# Patient Record
Sex: Female | Born: 1965
Health system: Southern US, Community
[De-identification: ages and names within clinical notes are randomized; demographics above are authoritative.]

## PROBLEM LIST (undated history)

## (undated) DIAGNOSIS — I8393 Asymptomatic varicose veins of bilateral lower extremities: Secondary | ICD-10-CM

## (undated) DIAGNOSIS — G5139 Clonic hemifacial spasm, unspecified: Principal | ICD-10-CM

## (undated) HISTORY — DX: Clonic hemifacial spasm, unspecified: G51.39

## (undated) HISTORY — DX: Asymptomatic varicose veins of bilateral lower extremities: I83.93

---

## 2000-08-09 ENCOUNTER — Other Ambulatory Visit: Admission: RE | Admit: 2000-08-09 | Discharge: 2000-08-09 | Payer: Self-pay | Admitting: *Deleted

## 2000-10-21 ENCOUNTER — Ambulatory Visit (HOSPITAL_COMMUNITY): Admission: AD | Admit: 2000-10-21 | Discharge: 2000-10-21 | Payer: Self-pay | Admitting: *Deleted

## 2000-11-01 ENCOUNTER — Inpatient Hospital Stay (HOSPITAL_COMMUNITY): Admission: RE | Admit: 2000-11-01 | Discharge: 2000-11-03 | Payer: Self-pay | Admitting: *Deleted

## 2017-08-23 DIAGNOSIS — R0789 Other chest pain: Secondary | ICD-10-CM | POA: Diagnosis not present

## 2017-08-24 DIAGNOSIS — G5131 Clonic hemifacial spasm, right: Secondary | ICD-10-CM | POA: Diagnosis not present

## 2017-11-16 ENCOUNTER — Ambulatory Visit (HOSPITAL_COMMUNITY)
Admission: RE | Admit: 2017-11-16 | Discharge: 2017-11-16 | Disposition: A | Discharge status: Home / Self Care | Payer: BLUE CROSS/BLUE SHIELD | Source: Ambulatory Visit | Attending: Physician Assistant | Admitting: Physician Assistant

## 2017-11-16 ENCOUNTER — Other Ambulatory Visit (HOSPITAL_COMMUNITY): Payer: Self-pay | Admitting: Physician Assistant

## 2017-11-16 DIAGNOSIS — H02843 Edema of right eye, unspecified eyelid: Secondary | ICD-10-CM | POA: Diagnosis not present

## 2017-11-16 DIAGNOSIS — R600 Localized edema: Secondary | ICD-10-CM | POA: Insufficient documentation

## 2017-11-16 DIAGNOSIS — M79661 Pain in right lower leg: Secondary | ICD-10-CM | POA: Diagnosis not present

## 2017-11-16 DIAGNOSIS — I83813 Varicose veins of bilateral lower extremities with pain: Secondary | ICD-10-CM | POA: Diagnosis not present

## 2017-11-20 ENCOUNTER — Other Ambulatory Visit: Payer: Self-pay

## 2017-11-20 DIAGNOSIS — I83893 Varicose veins of bilateral lower extremities with other complications: Secondary | ICD-10-CM

## 2017-11-21 DIAGNOSIS — G5131 Clonic hemifacial spasm, right: Secondary | ICD-10-CM | POA: Diagnosis not present

## 2017-11-22 ENCOUNTER — Other Ambulatory Visit: Payer: Self-pay | Admitting: Oculoplastics Ophthalmology

## 2017-11-22 DIAGNOSIS — G5131 Clonic hemifacial spasm, right: Secondary | ICD-10-CM

## 2017-11-30 ENCOUNTER — Ambulatory Visit (HOSPITAL_COMMUNITY): Payer: Self-pay

## 2017-11-30 ENCOUNTER — Ambulatory Visit (HOSPITAL_COMMUNITY)
Admission: RE | Admit: 2017-11-30 | Discharge: 2017-11-30 | Disposition: A | Discharge status: Home / Self Care | Payer: BLUE CROSS/BLUE SHIELD | Source: Ambulatory Visit | Attending: Oculoplastics Ophthalmology | Admitting: Oculoplastics Ophthalmology

## 2017-11-30 DIAGNOSIS — G245 Blepharospasm: Secondary | ICD-10-CM | POA: Diagnosis not present

## 2017-11-30 DIAGNOSIS — G5131 Clonic hemifacial spasm, right: Secondary | ICD-10-CM | POA: Diagnosis not present

## 2017-11-30 MED ORDER — GADOBENATE DIMEGLUMINE 529 MG/ML IV SOLN
19.0000 mL | Freq: Once | INTRAVENOUS | Status: AC | PRN
  Administered 2017-11-30: 19 mL via INTRAVENOUS

## 2017-12-08 ENCOUNTER — Ambulatory Visit: Payer: BLUE CROSS/BLUE SHIELD | Admitting: Neurology

## 2017-12-22 DIAGNOSIS — Z0000 Encounter for general adult medical examination without abnormal findings: Secondary | ICD-10-CM | POA: Diagnosis not present

## 2017-12-29 DIAGNOSIS — Z01419 Encounter for gynecological examination (general) (routine) without abnormal findings: Secondary | ICD-10-CM | POA: Diagnosis not present

## 2017-12-29 DIAGNOSIS — Z6833 Body mass index (BMI) 33.0-33.9, adult: Secondary | ICD-10-CM | POA: Diagnosis not present

## 2017-12-29 DIAGNOSIS — R300 Dysuria: Secondary | ICD-10-CM | POA: Diagnosis not present

## 2018-01-04 ENCOUNTER — Encounter: Payer: Self-pay | Admitting: Surgery

## 2018-02-08 ENCOUNTER — Encounter: Payer: Self-pay | Admitting: Neurology

## 2018-02-08 ENCOUNTER — Ambulatory Visit: Payer: BLUE CROSS/BLUE SHIELD | Admitting: Neurology

## 2018-02-08 DIAGNOSIS — G5139 Clonic hemifacial spasm, unspecified: Secondary | ICD-10-CM

## 2018-02-08 HISTORY — DX: Clonic hemifacial spasm, unspecified: G51.39

## 2018-02-08 MED ORDER — BACLOFEN 10 MG PO TABS: 5.0000 mg | ORAL_TABLET | Freq: Two times a day (BID) | ORAL | 2 refills | Status: DC

## 2018-02-08 NOTE — Progress Notes (Signed)
Reason for visit: Right hemifacial spasm  Referring physician: Dr. Manus Rudd is a 52 y.o. female  History of present illness:  Ms. Lapaglia is a 52 year old right-handed white female with a history of onset of right hemifacial spasm that began about 2 years ago.  The patient has had gradual worsening of her symptoms, she does report some occasional mild discomfort over the right frontotemporal area.  The twitches mainly involve the muscles around the right eye and around the mouth on the right.  The patient denies any involvement of the pharyngeal muscles or tongue.  She has never had Bell's palsy previously.  The patient has not had any weakness or numbness of the arms or legs, she denies any balance changes or difficulty controlling the bowels or the bladder.  The patient denies any double vision or loss of vision.  She does report some photophobia at times and she has to wear dark glasses a lot.  The patient has undergone MRI evaluation that included the brainstem area that did not show evidence of compression of the facial nerve.  The patient is sent to this office for further evaluation and management of her symptoms.  Past Medical History:  Diagnosis Date  . Hemifacial spasm 02/08/2018   right  . Varicose veins of both lower extremities     History reviewed. No pertinent surgical history.  Family History  Problem Relation Age of Onset  . Cirrhosis Mother   . Hypertension Father   . COPD Brother     Social history:  reports that she has never smoked. She has never used smokeless tobacco. She reports that she does not drink alcohol or use drugs.  Medications:  Prior to Admission medications   Medication Sig Start Date End Date Taking? Authorizing Provider  triamcinolone lotion (KENALOG) 0.1 % Apply 1 application topically 2 (two) times daily.   Yes [provider]  baclofen (LIORESAL) 10 MG tablet Take 0.5 tablets (5 mg total) by mouth 2 (two) times daily.  02/08/18   York Spaniel, MD     No Known Allergies  ROS:  Out of a complete 14 system review of symptoms, the patient complains only of the following symptoms, and all other reviewed systems are negative.  Swelling in the legs Headache  Blood pressure (!) 141/93, pulse 64, height 5\' 7"  (1.702 m), weight 208 lb (94.3 kg).  Physical Exam  General: The patient is alert and cooperative at the time of the examination.  Patient is moderately obese.  Eyes: Pupils are equal, round, and reactive to light. Discs are flat bilaterally.  Neck: The neck is supple, no carotid bruits are noted.  Respiratory: The respiratory examination is clear.  Cardiovascular: The cardiovascular examination reveals a regular rate and rhythm, no obvious murmurs or rubs are noted.  Skin: Extremities are with 2+ edema below the knees bilaterally.  Neurologic Exam  Mental status: The patient is alert and oriented x 3 at the time of the examination. The patient has apparent normal recent and remote memory, with an apparently normal attention span and concentration ability.  Cranial nerves: Facial symmetry is present. There is good sensation of the face to pinprick and soft touch bilaterally. The strength of the facial muscles and the muscles to head turning and shoulder shrug are normal bilaterally. Speech is well enunciated, no aphasia or dysarthria is noted. Extraocular movements are full. Visual fields are full. The tongue is midline, and the patient has symmetric elevation  of the soft palate. No obvious hearing deficits are noted.  Upon observation of the face, there are continuous twitches involving the facial muscles from the eye level down to include the platysma on the right.  There is partial closure of the right eye and some pulling at the corner of the mouth on the right.  Motor: The motor testing reveals 5 over 5 strength of all 4 extremities. Good symmetric motor tone is noted throughout.  Sensory:  Sensory testing is intact to pinprick, soft touch, vibration sensation, and position sense on all 4 extremities. No evidence of extinction is noted.  Coordination: Cerebellar testing reveals good finger-nose-finger and heel-to-shin bilaterally.  Gait and station: Gait is normal. Tandem gait is normal. Romberg is negative. No drift is seen.  Reflexes: Deep tendon reflexes are symmetric and normal bilaterally. Toes are downgoing bilaterally.   MRI 11/30/17:  IMPRESSION: No mass, abnormal enhancement, or significant neurovascular compression of the trigeminal nerve complexes.  * MRI scan images were reviewed online. I agree with the written report.    Assessment/Plan:  1.  Right hemifacial spasm  The patient indicates that she is not sure she wants to have Botox therapy which represents the most effective treatment for hemifacial spasm.  Medications can be used to reduce symptoms but are usually less effective than Botox.  Gabapentin, carbamazepine, clonazepam, and baclofen can be used as therapeutic agents.  We will start with low-dose baclofen taking 5 mg twice daily, she will call for any dose adjustments.  She will follow-up in 3 months.  Marlan Palau MD 02/08/2018 3:35 PM  [facility] [address] [place], [place] [address]  Phone [phone] Fax [phone]

## 2018-02-08 NOTE — Patient Instructions (Signed)
We will start baclofen 10 mg taking one half tablet twice a day, call for any dose adjustments.

## 2018-03-05 ENCOUNTER — Encounter: Payer: BLUE CROSS/BLUE SHIELD | Admitting: Surgery

## 2018-03-05 ENCOUNTER — Ambulatory Visit (HOSPITAL_COMMUNITY): Payer: BLUE CROSS/BLUE SHIELD | Attending: Surgery

## 2018-03-06 ENCOUNTER — Encounter: Payer: Self-pay | Admitting: Surgery

## 2018-03-13 ENCOUNTER — Telehealth: Payer: Self-pay | Admitting: Neurology

## 2018-03-13 MED ORDER — BACLOFEN 10 MG PO TABS
10.0000 mg | ORAL_TABLET | Freq: Two times a day (BID) | ORAL | 2 refills | Status: DC
Start: 2018-03-13 — End: 2018-05-30

## 2018-03-13 NOTE — Telephone Encounter (Signed)
I called the patient.  The patient is tolerating the low-dose baclofen but it is not helping her hemifacial spasm.  We will increase the dose to 10 mg twice daily.

## 2018-03-13 NOTE — Telephone Encounter (Signed)
Pt requesting a call stating she would like to discuss rasing her dosing to 1 tablet in the morning and 1 tablet at night for medication baclofen (LIORESAL) 10 MG tablet, please call to advise

## 2018-03-13 NOTE — Telephone Encounter (Signed)
Spoke with pt. She sts. she is tolerating Baclofen 5mg  bid well with no drowsiness, gait disturbance, but does not feel it is helping hemifacial spasms. She would like to increase to 10mg  bid. Will check with Dr. Ignacia Bayley

## 2018-03-16 DIAGNOSIS — Z6834 Body mass index (BMI) 34.0-34.9, adult: Secondary | ICD-10-CM | POA: Diagnosis not present

## 2018-03-16 DIAGNOSIS — R21 Rash and other nonspecific skin eruption: Secondary | ICD-10-CM | POA: Diagnosis not present

## 2018-04-16 DIAGNOSIS — Z0289 Encounter for other administrative examinations: Secondary | ICD-10-CM

## 2018-05-30 ENCOUNTER — Telehealth: Payer: Self-pay | Admitting: Neurology

## 2018-05-30 MED ORDER — BACLOFEN 10 MG PO TABS: 10.0000 mg | ORAL_TABLET | Freq: Three times a day (TID) | ORAL | 2 refills | Status: DC

## 2018-05-30 NOTE — Addendum Note (Signed)
Addended by: York Spaniel on: 05/30/2018 04:30 PM   Modules accepted: Orders

## 2018-05-30 NOTE — Telephone Encounter (Signed)
Pt has called to see if Dr Anne Hahn will increase her take of the baclofen (LIORESAL) 10 MG tablet to 1 extra pill for full effect.  Please call,  [id number] - [place], [place] *no changes to insurance*

## 2018-05-30 NOTE — Telephone Encounter (Signed)
I called the patient.  The patient is on baclofen 10 mg twice daily, she is not getting much benefit from this, we will go up to 10 mg 3 times daily.  The treatment of choice once again for the hemifacial spasm is Botox injections.

## 2018-06-06 ENCOUNTER — Ambulatory Visit: Payer: BLUE CROSS/BLUE SHIELD | Admitting: Nurse Practitioner

## 2018-07-04 DIAGNOSIS — Z6835 Body mass index (BMI) 35.0-35.9, adult: Secondary | ICD-10-CM | POA: Diagnosis not present

## 2018-07-04 DIAGNOSIS — M546 Pain in thoracic spine: Secondary | ICD-10-CM | POA: Diagnosis not present

## 2018-07-05 ENCOUNTER — Telehealth: Payer: Self-pay | Admitting: Neurology

## 2018-07-05 MED ORDER — BACLOFEN 10 MG PO TABS: 10.0000 mg | ORAL_TABLET | Freq: Four times a day (QID) | ORAL | 2 refills | Status: DC

## 2018-07-05 NOTE — Telephone Encounter (Signed)
Pt is calling to see if she is able to take her baclofen (LIORESAL) 10 MG tablet 4 times a day instead of the 3 since 3 times is not helping. Please advise.

## 2018-07-05 NOTE — Telephone Encounter (Signed)
I called the patient.  The baclofen dose can be increased, we will go to 10 mg 4 times daily.  The maximum dose is 80 mg daily.

## 2018-10-31 DIAGNOSIS — R21 Rash and other nonspecific skin eruption: Secondary | ICD-10-CM | POA: Diagnosis not present

## 2018-10-31 DIAGNOSIS — Z6836 Body mass index (BMI) 36.0-36.9, adult: Secondary | ICD-10-CM | POA: Diagnosis not present

## 2019-05-18 DIAGNOSIS — I83892 Varicose veins of left lower extremities with other complications: Secondary | ICD-10-CM | POA: Diagnosis not present

## 2019-07-09 DIAGNOSIS — L304 Erythema intertrigo: Secondary | ICD-10-CM | POA: Diagnosis not present

## 2019-07-09 DIAGNOSIS — M25569 Pain in unspecified knee: Secondary | ICD-10-CM | POA: Diagnosis not present

## 2019-07-09 DIAGNOSIS — R609 Edema, unspecified: Secondary | ICD-10-CM | POA: Diagnosis not present

## 2019-07-09 DIAGNOSIS — I8390 Asymptomatic varicose veins of unspecified lower extremity: Secondary | ICD-10-CM | POA: Diagnosis not present

## 2019-07-09 DIAGNOSIS — Z6837 Body mass index (BMI) 37.0-37.9, adult: Secondary | ICD-10-CM | POA: Diagnosis not present

## 2019-07-23 DIAGNOSIS — Z6835 Body mass index (BMI) 35.0-35.9, adult: Secondary | ICD-10-CM | POA: Diagnosis not present

## 2019-07-23 DIAGNOSIS — M25562 Pain in left knee: Secondary | ICD-10-CM | POA: Diagnosis not present

## 2019-08-09 DIAGNOSIS — M25562 Pain in left knee: Secondary | ICD-10-CM | POA: Diagnosis not present

## 2019-08-09 DIAGNOSIS — I8390 Asymptomatic varicose veins of unspecified lower extremity: Secondary | ICD-10-CM | POA: Diagnosis not present

## 2019-08-09 DIAGNOSIS — Z6835 Body mass index (BMI) 35.0-35.9, adult: Secondary | ICD-10-CM | POA: Diagnosis not present

## 2019-08-09 DIAGNOSIS — R609 Edema, unspecified: Secondary | ICD-10-CM | POA: Diagnosis not present

## 2019-08-19 DIAGNOSIS — M25462 Effusion, left knee: Secondary | ICD-10-CM | POA: Diagnosis not present

## 2019-08-19 DIAGNOSIS — M25562 Pain in left knee: Secondary | ICD-10-CM | POA: Diagnosis not present

## 2019-09-09 DIAGNOSIS — M1712 Unilateral primary osteoarthritis, left knee: Secondary | ICD-10-CM | POA: Diagnosis not present

## 2019-09-09 DIAGNOSIS — M25562 Pain in left knee: Secondary | ICD-10-CM | POA: Diagnosis not present

## 2019-10-15 DIAGNOSIS — E039 Hypothyroidism, unspecified: Secondary | ICD-10-CM | POA: Diagnosis not present

## 2019-10-15 DIAGNOSIS — R739 Hyperglycemia, unspecified: Secondary | ICD-10-CM | POA: Diagnosis not present

## 2019-10-15 DIAGNOSIS — E785 Hyperlipidemia, unspecified: Secondary | ICD-10-CM | POA: Diagnosis not present

## 2019-10-15 DIAGNOSIS — R7303 Prediabetes: Secondary | ICD-10-CM | POA: Diagnosis not present

## 2019-10-15 DIAGNOSIS — Z0000 Encounter for general adult medical examination without abnormal findings: Secondary | ICD-10-CM | POA: Diagnosis not present

## 2019-10-17 DIAGNOSIS — Z0000 Encounter for general adult medical examination without abnormal findings: Secondary | ICD-10-CM | POA: Diagnosis not present

## 2019-10-21 ENCOUNTER — Other Ambulatory Visit (HOSPITAL_COMMUNITY): Payer: Self-pay | Admitting: Sports Medicine

## 2019-10-21 ENCOUNTER — Other Ambulatory Visit: Payer: Self-pay | Admitting: Sports Medicine

## 2019-10-21 DIAGNOSIS — M25562 Pain in left knee: Secondary | ICD-10-CM | POA: Diagnosis not present

## 2019-10-21 DIAGNOSIS — M1712 Unilateral primary osteoarthritis, left knee: Secondary | ICD-10-CM | POA: Diagnosis not present

## 2019-10-21 DIAGNOSIS — S83242A Other tear of medial meniscus, current injury, left knee, initial encounter: Secondary | ICD-10-CM

## 2019-11-26 ENCOUNTER — Ambulatory Visit (HOSPITAL_COMMUNITY)
Admission: RE | Admit: 2019-11-26 | Discharge: 2019-11-26 | Disposition: A | Discharge status: Home / Self Care | Payer: BC Managed Care – PPO | Source: Ambulatory Visit | Attending: Sports Medicine | Admitting: Sports Medicine

## 2019-11-26 ENCOUNTER — Other Ambulatory Visit: Payer: Self-pay

## 2019-11-26 DIAGNOSIS — M25562 Pain in left knee: Secondary | ICD-10-CM | POA: Diagnosis not present

## 2019-11-26 DIAGNOSIS — S83242A Other tear of medial meniscus, current injury, left knee, initial encounter: Secondary | ICD-10-CM | POA: Diagnosis not present

## 2019-12-03 DIAGNOSIS — S83242D Other tear of medial meniscus, current injury, left knee, subsequent encounter: Secondary | ICD-10-CM | POA: Diagnosis not present

## 2020-02-09 IMAGING — US US EXTREM LOW VENOUS*R*
1 series · 13 of 24 positions shown · non-contrast
Comparison: None.

CLINICAL DATA: Chronic right lower extremity pain and edema.
Evaluate for DVT.



[Series 1: us extrem low venous*right* · 0.09mm/px · 13 of 36 slices shown]
[im 1/36]
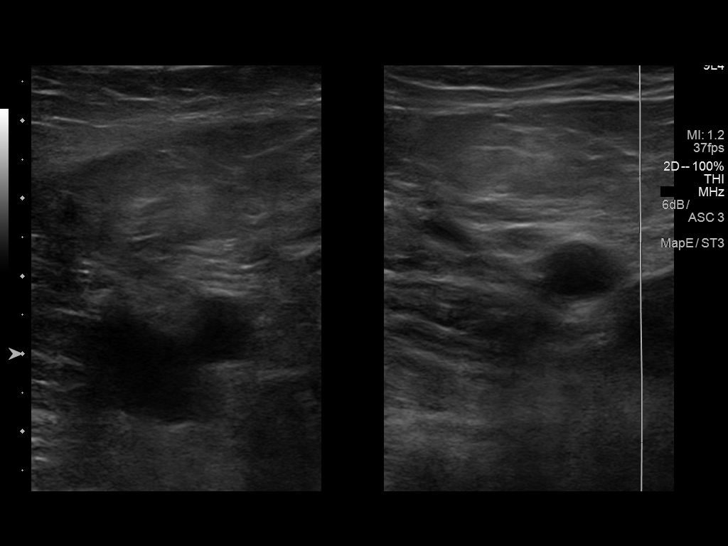
[im 4/36]
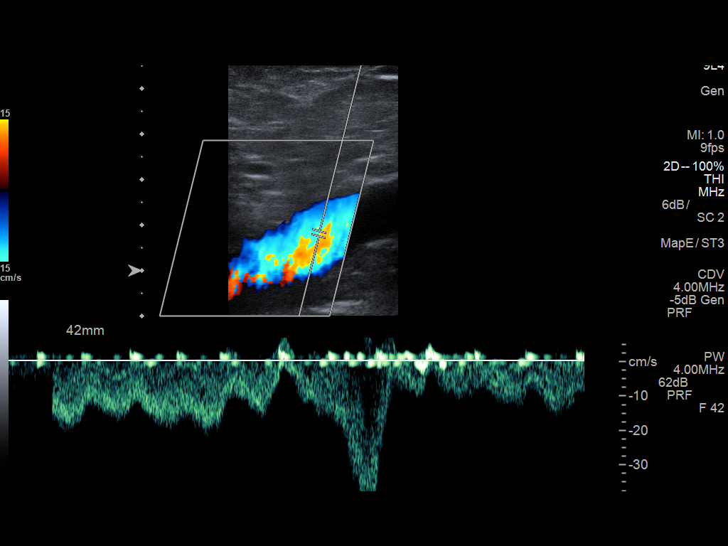
[im 7/36]
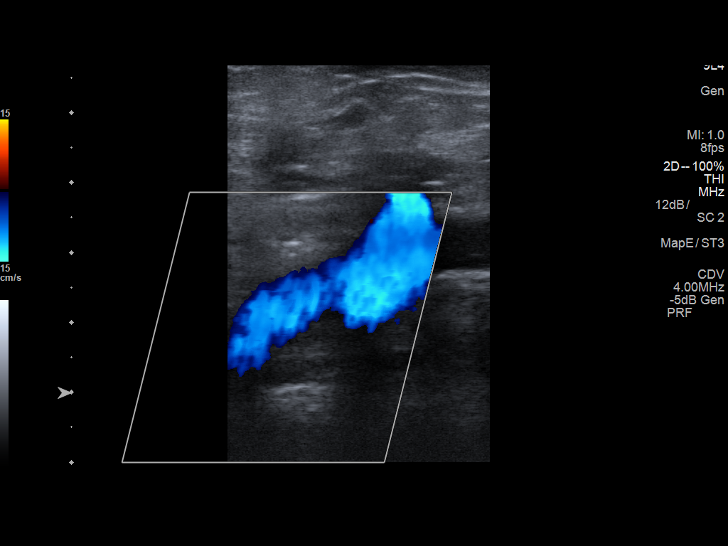
[im 10/36]
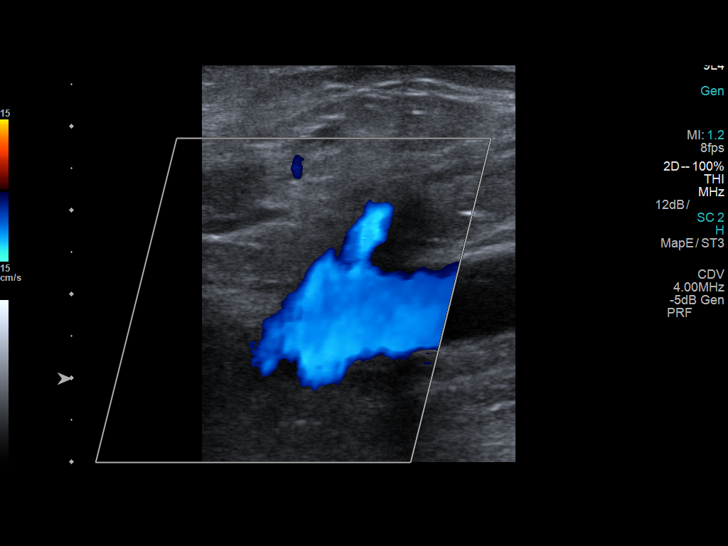
[im 13/36]
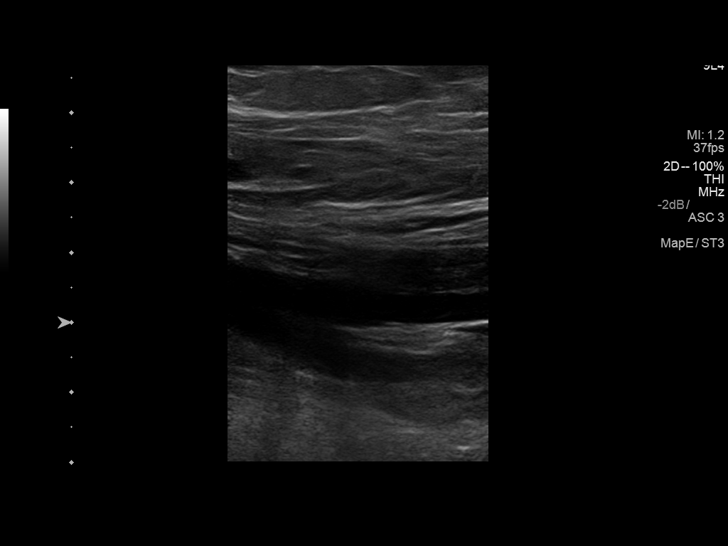
[im 16/36]
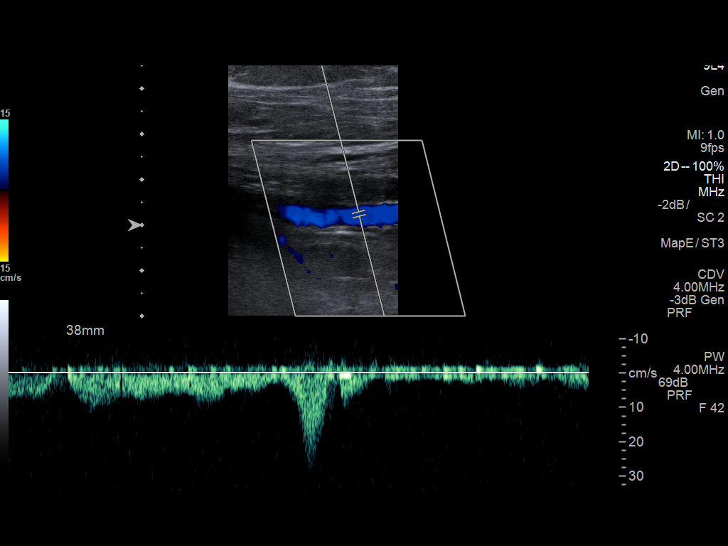
[im 19/36]
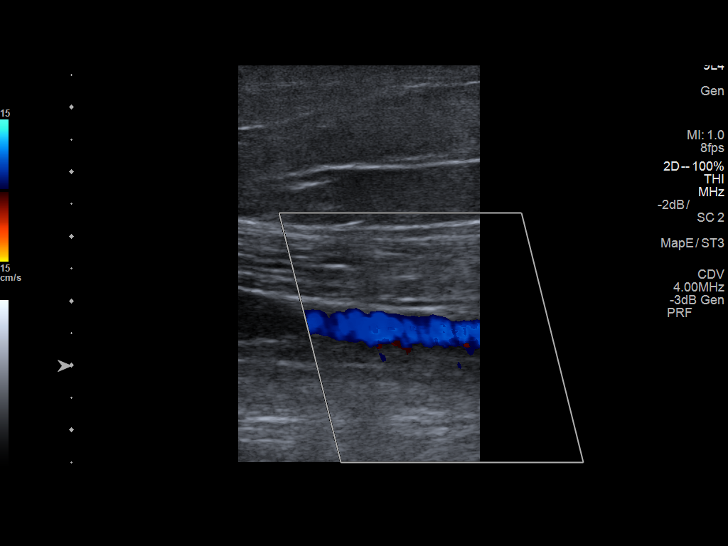
[im 20/36]
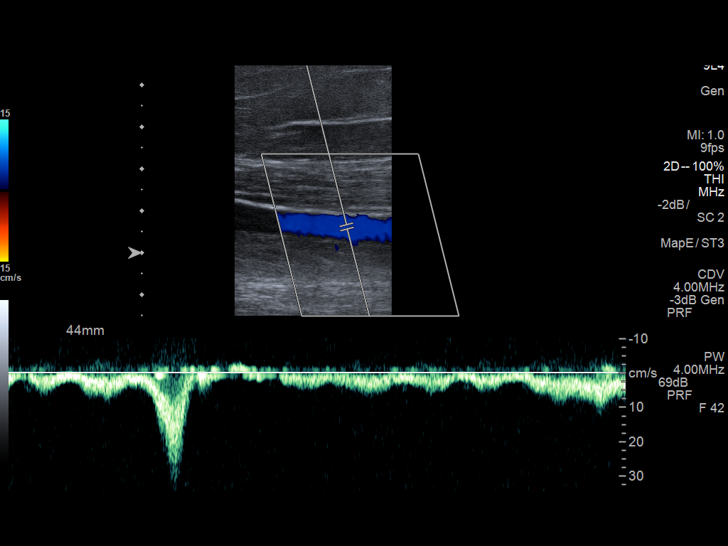
[im 23/36]
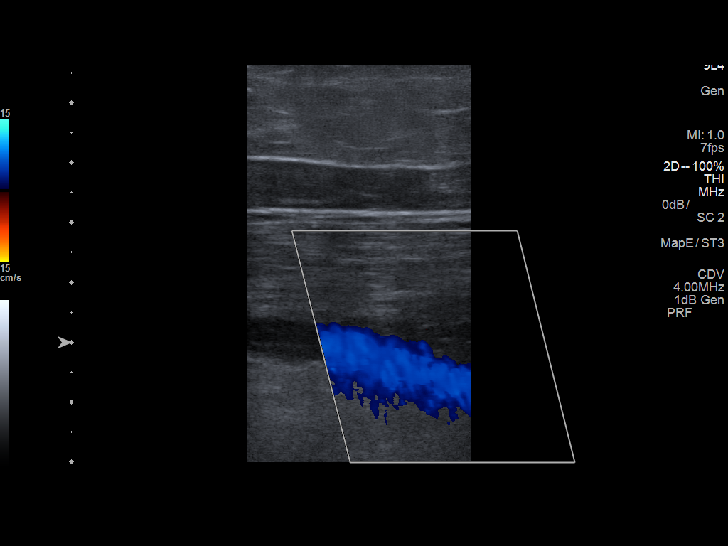
[im 26/36]
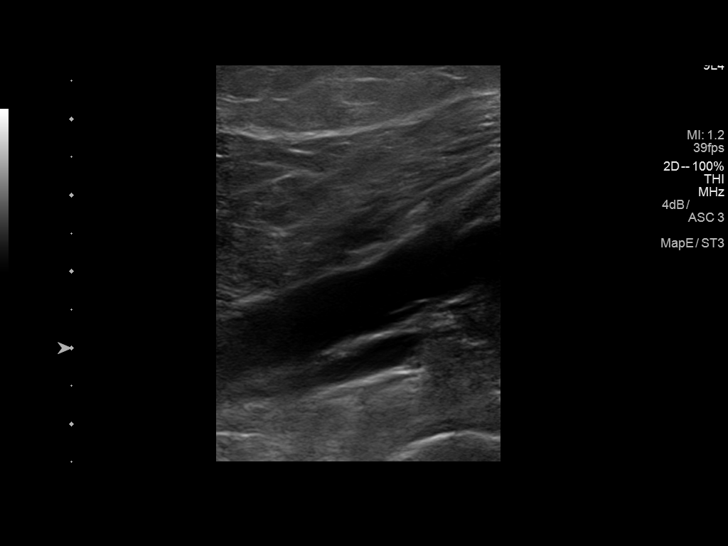
[im 29/36]
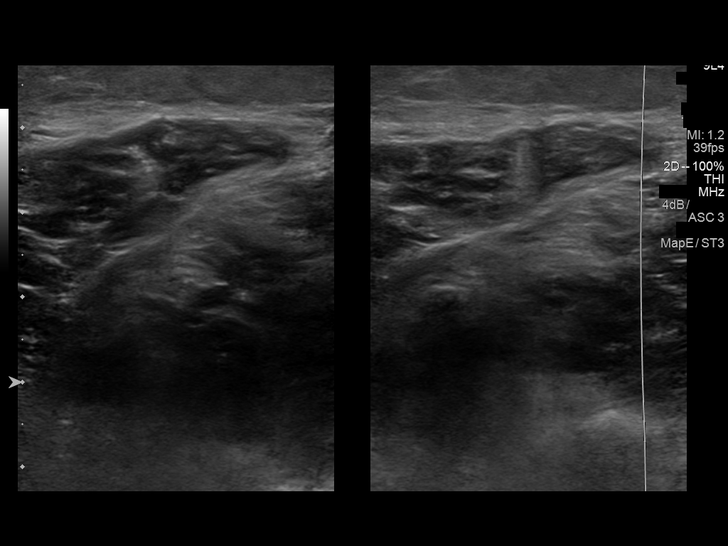
[im 32/36]
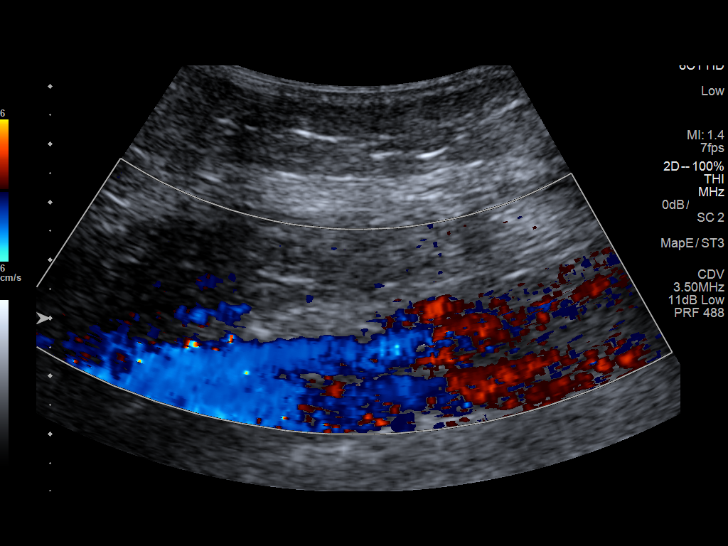
[im 36/36]
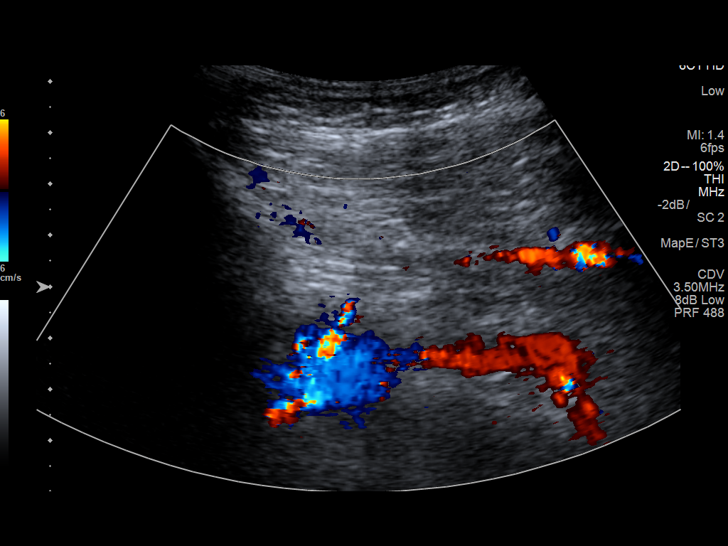

[13 of 24 positions shown; findings below may reference images not displayed]

FINDINGS: Contralateral Common Femoral Vein: Respiratory phasicity is normal
and symmetric with the symptomatic side. No evidence of thrombus.
Normal compressibility.

Common Femoral Vein: No evidence of thrombus. Normal
compressibility, respiratory phasicity and response to augmentation.

Saphenofemoral Junction: No evidence of thrombus. Normal
compressibility and flow on color Doppler imaging.

Profunda Femoral Vein: No evidence of thrombus. Normal
compressibility and flow on color Doppler imaging.

Femoral Vein: No evidence of thrombus. Normal compressibility,
respiratory phasicity and response to augmentation.

Popliteal Vein: No evidence of thrombus. Normal compressibility,
respiratory phasicity and response to augmentation.

Calf Veins: No evidence of thrombus. Normal compressibility and flow
on color Doppler imaging.

Superficial Great Saphenous Vein: No evidence of thrombus. Normal
compressibility.

Venous Reflux:  None.

Other Findings:  None.
IMPRESSION: No evidence of DVT within the right lower extremity.

## 2021-07-12 IMAGING — MR MR KNEE*L* W/O CM
7 series · 40 of 40 positions shown · non-contrast
Comparison: None.

CLINICAL DATA: Left knee pain and swelling for 4 months. No known
injury.

EXAM:
MRI OF THE LEFT KNEE WITHOUT CONTRAST
TECHNIQUE: Multiplanar, multisequence MR imaging of the knee was performed. No
intravenous contrast was administered.

[Series 8: T2 fat-sat · axial · left · 4.0mm · 0.39mm/px · z∈[-92,+33]mm · 7 of 26 slices shown (1 of 3)]
[im 1/26]
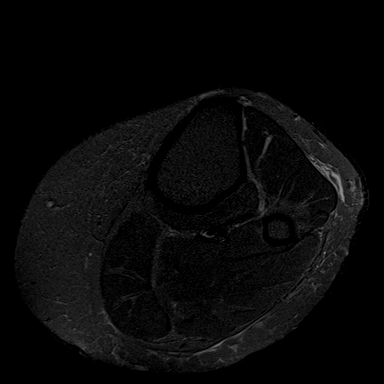
[im 5/26]
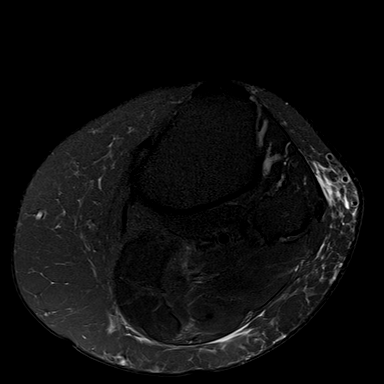
[im 9/26]
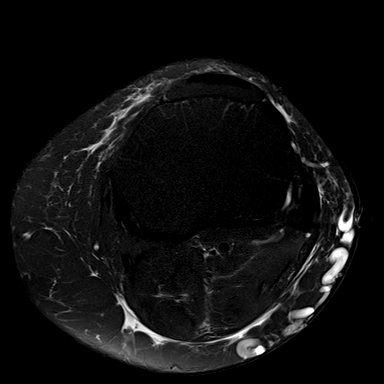
[im 13/26]
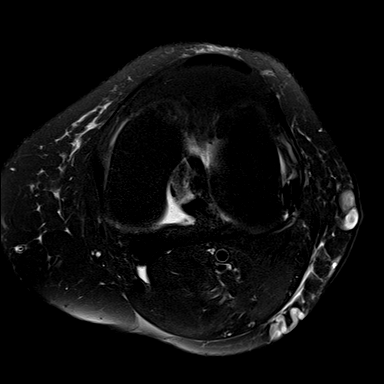
[im 17/26]
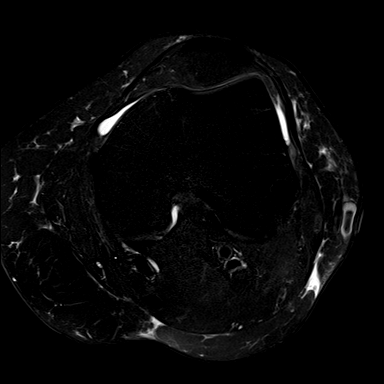
[im 21/26]
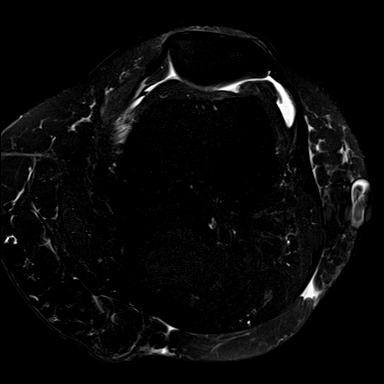
[im 26/26]
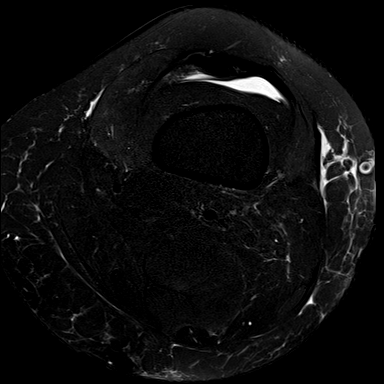

[Series 9: T1 · coronal · left · 4.0mm · 0.47mm/px · 6 of 22 slices shown]
[im 1/22]
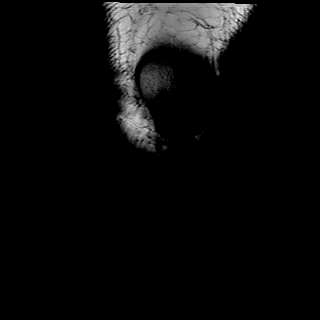
[im 5/22]
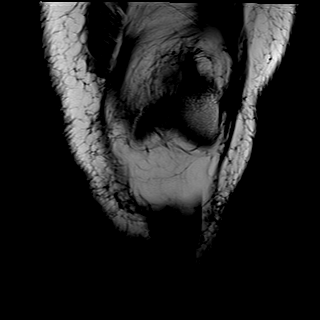
[im 9/22]
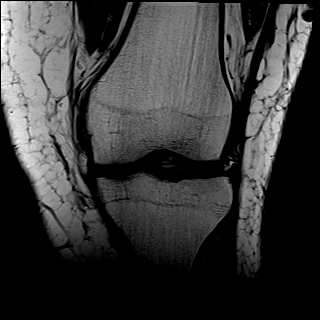
[im 13/22]
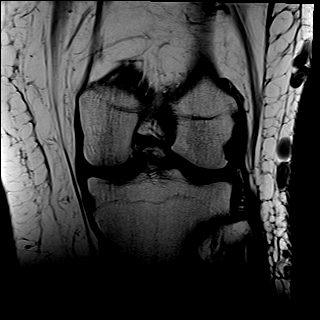
[im 17/22]
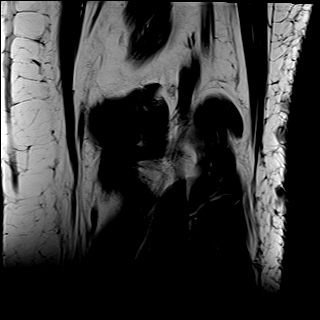
[im 22/22]
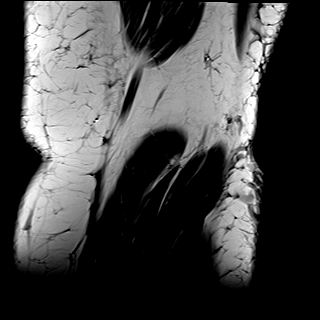

[Series 10: T2 fat-sat · coronal · left · 4.0mm · 0.47mm/px · 6 of 21 slices shown (2 of 3)]
[im 1/21]
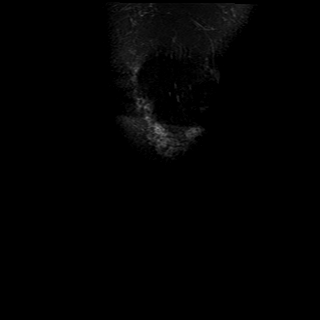
[im 5/21]
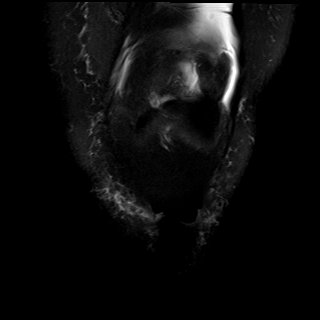
[im 9/21]
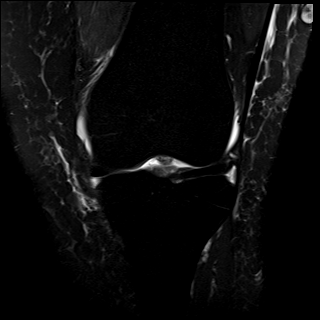
[im 13/21]
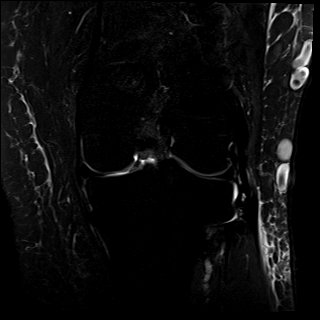
[im 17/21]
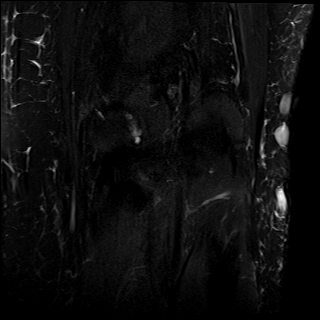
[im 21/21]
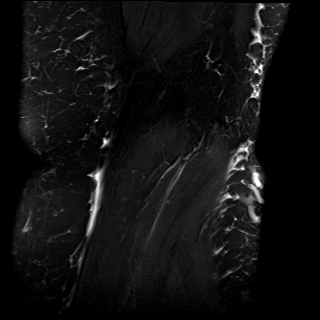

[Series 11: PD fat-sat · coronal · left · 4.0mm · 0.47mm/px · 6 of 22 slices shown (1 of 2)]
[im 1/22]
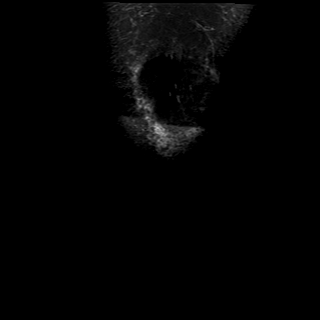
[im 5/22]
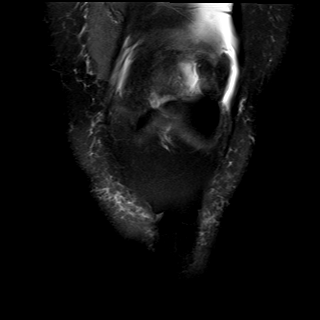
[im 9/22]
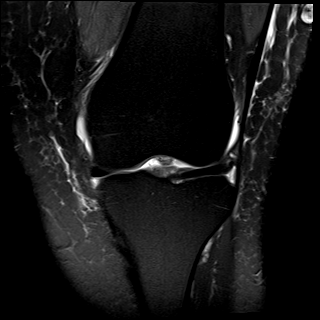
[im 13/22]
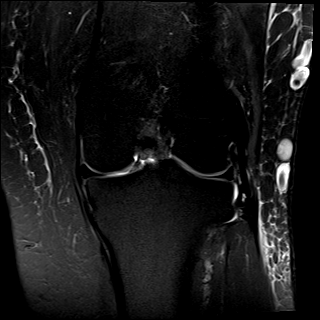
[im 17/22]
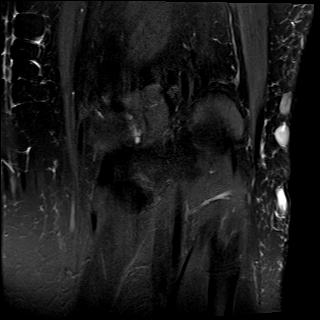
[im 22/22]
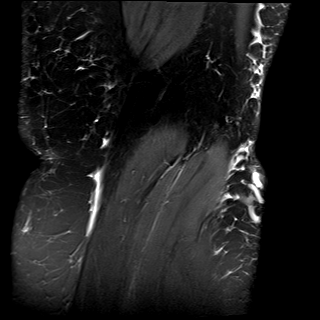

[Series 12: PD fat-sat · sagittal · left · 3.0mm · 0.47mm/px · 6 of 24 slices shown (2 of 2)]
[im 1/24]
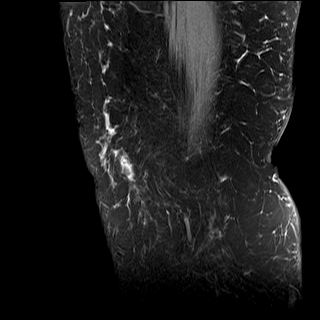
[im 5/24]
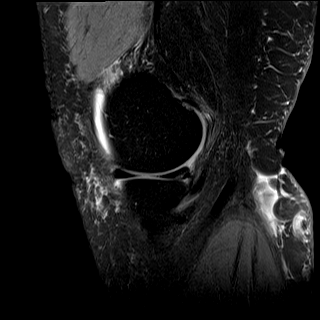
[im 10/24]
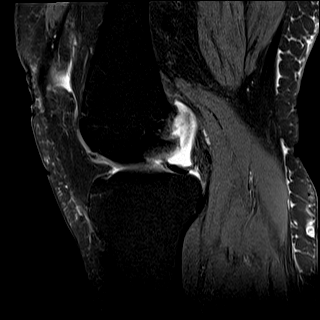
[im 14/24]
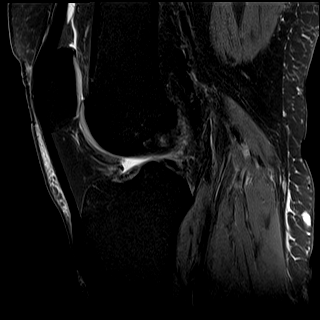
[im 19/24]
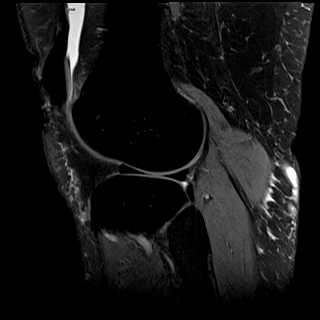
[im 24/24]
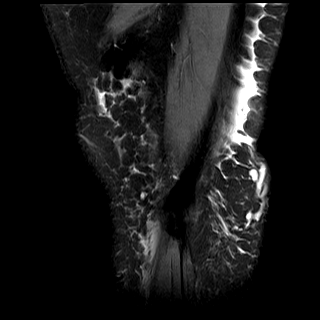

[Series 13: T2 fat-sat · sagittal · left · 3.0mm · 0.47mm/px · 6 of 24 slices shown (3 of 3)]
[im 1/24]
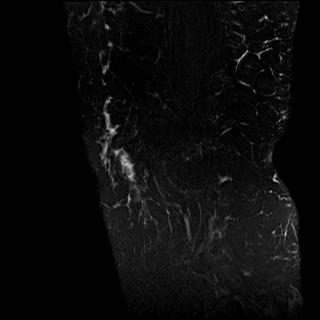
[im 5/24]
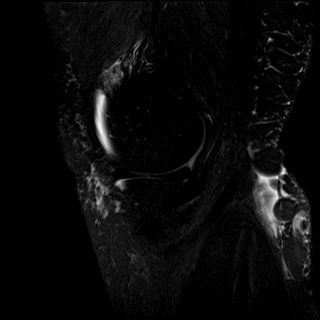
[im 10/24]
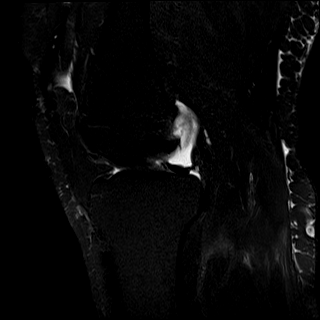
[im 14/24]
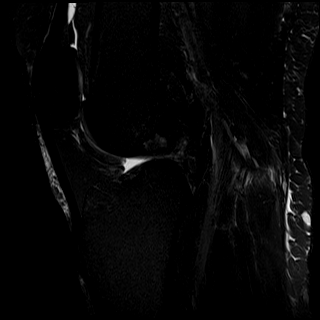
[im 19/24]
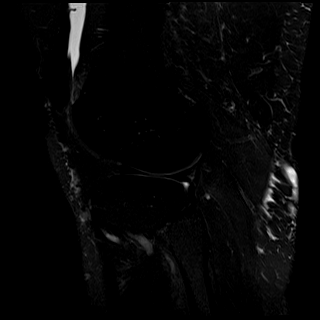
[im 24/24]
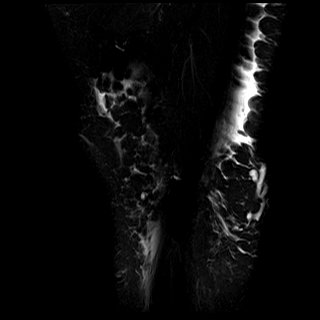

[Series 14: PD · coronal · left · 2.0mm · 0.47mm/px · 3 of 10 slices shown]
[im 1/10]
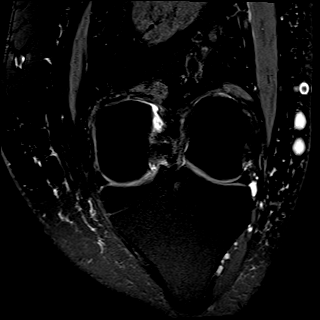
[im 5/10]
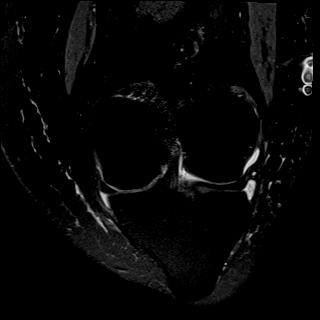
[im 10/10]
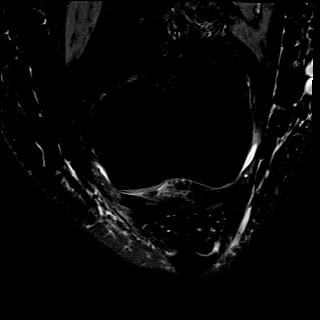

[40 of 40 positions shown; findings below may reference images not displayed]

FINDINGS: MENISCI

Medial: Oblique tear of the posterior horn of the medial meniscus
extending to the inferior articular surface.

Lateral: Intact.

LIGAMENTS

Cruciates: ACL and PCL are intact.

Collaterals: Medial collateral ligament is intact. Lateral
collateral ligament complex is intact.

CARTILAGE

Patellofemoral:  No chondral defect.

Medial: Mild partial-thickness cartilage loss of the medial
femorotibial compartment.

Lateral:  No chondral defect.

JOINT: No joint effusion. Normal Is Mhe Ganiu. No plical
thickening.

POPLITEAL FOSSA: Popliteus tendon is intact. Tiny Baker's cyst.

EXTENSOR MECHANISM: Intact quadriceps tendon. Intact patellar
tendon. Intact lateral patellar retinaculum. Intact medial patellar
retinaculum. Intact MPFL.

BONES: No marrow signal abnormality. No fracture or dislocation.

Other: No fluid collection or hematoma. Muscles are normal.
IMPRESSION: 1. Oblique tear of the posterior horn of the medial meniscus
extending to the inferior articular surface.
2. Mild partial-thickness cartilage loss of the medial femorotibial
compartment.

## 2024-02-22 ENCOUNTER — Other Ambulatory Visit: Payer: Self-pay

## 2024-02-22 DIAGNOSIS — R600 Localized edema: Secondary | ICD-10-CM

## 2024-03-21 ENCOUNTER — Ambulatory Visit (HOSPITAL_COMMUNITY)
Admission: RE | Admit: 2024-03-21 | Discharge: 2024-03-21 | Disposition: A | Discharge status: Home / Self Care | Source: Ambulatory Visit | Attending: Vascular Surgery | Admitting: Vascular Surgery

## 2024-03-21 ENCOUNTER — Ambulatory Visit: Admitting: Vascular Surgery

## 2024-03-21 VITALS — BP 145/95 | HR 96 | Temp 97.7°F | Resp 20 | Ht 67.0 in | Wt 227.9 lb

## 2024-03-21 DIAGNOSIS — R600 Localized edema: Secondary | ICD-10-CM

## 2024-03-21 NOTE — Progress Notes (Signed)
 Office Note     CC: Lower extremity edema Requesting Provider:  Jolee Greig DEL, PA-C  HPI: Sheila Wolfe is a 58 y.o. (May 05, 1965) female who presents at the request of Allwardt, Alyssa M, PA-C for evaluation of lower extremity edema.  On exam, Sheila Wolfe was doing well.  A native of [place], she currently resides in [place].  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  Her oldest son has autism, and continues to live at home.  Sheila Wolfe has had bilateral lower extremity edema for years.  She has had episodes of bleeding and ulceration.  She has worn compression stockings in the past, but stated they ripped quite a while ago, and were $70, therefore she did not buy another pair.  She appreciates heavy, tired feeling by days end.  She notes significant swelling at the level of the ankle and more proximally into the calf.  No significant edema in the forefoot or the toes.  Notes with cokes boots, but denies pain.  When she wore compression, she stated that it did help.  No history of DVT.  No history of vascular surgery intervention. Denies symptoms of claudication, ischemic rest pain, tissue loss.   Past Medical History:  Diagnosis Date   Hemifacial spasm 02/08/2018   right   Varicose veins of both lower extremities     No past surgical history on file.  Social History   Socioeconomic History   Marital status: Married    Spouse name: Not on file   Number of children: 2   Years of education: Not on file   Highest education level: 8th grade  Occupational History   Not on file  Tobacco Use   Smoking status: Never   Smokeless tobacco: Never  Substance and Sexual Activity   Alcohol use: Never   Drug use: Never   Sexual activity: Not on file  Other Topics Concern   Not on file  Social History Narrative   Not on file   Social Drivers of Health   Financial Resource Strain: Not on file  Food Insecurity: Not on file  Transportation Needs: Not on file  Physical Activity: Not  on file  Stress: Not on file  Social Connections: Not on file  Intimate Partner Violence: Not on file   Family History  Problem Relation Age of Onset   Cirrhosis Mother    Hypertension Father    COPD Brother     No current outpatient medications on file.   No current facility-administered medications for this visit.    No Known Allergies   REVIEW OF SYSTEMS:  [X]  denotes positive finding, [ ]  denotes negative finding Cardiac  Comments:  Chest pain or chest pressure:    Shortness of breath upon exertion:    Short of breath when lying flat:    Irregular heart rhythm:        Vascular    Pain in calf, thigh, or hip brought on by ambulation:    Pain in feet at night that wakes you up from your sleep:     Blood clot in your veins:    Leg swelling:         Pulmonary    Oxygen at home:    Productive cough:     Wheezing:         Neurologic    Sudden weakness in arms or legs:     Sudden numbness in arms or legs:     Sudden onset of difficulty speaking  or slurred speech:    Temporary loss of vision in one eye:     Problems with dizziness:         Gastrointestinal    Blood in stool:     Vomited blood:         Genitourinary    Burning when urinating:     Blood in urine:        Psychiatric    Major depression:         Hematologic    Bleeding problems:    Problems with blood clotting too easily:        Skin    Rashes or ulcers:        Constitutional    Fever or chills:      PHYSICAL EXAMINATION:  Vitals:   03/21/24 1111  BP: (!) 145/95  Pulse: 96  Resp: 20  Temp: 97.7 F (36.5 C)  TempSrc: Temporal  SpO2: 99%  Weight: 227 lb 14.4 oz (103.4 kg)  Height: 5' 7 (1.702 m)    General:  WDWN in NAD; vital signs documented above Gait: Not observed HENT: WNL, normocephalic Pulmonary: normal non-labored breathing , without Rales, rhonchi,  wheezing Cardiac: regular HR Abdomen: soft, NT, no masses Skin: without rashes Vascular Exam/Pulses:  Right Left   Radial 2+ (normal) 2+ (normal)  Ulnar    Femoral    Popliteal    DP 2+ (normal) 2+ (normal)  PT     Extremities: without ischemic changes, without Gangrene , without cellulitis; without open wounds;  Musculoskeletal: no muscle wasting or atrophy  Neurologic: A&O X 3;  No focal weakness or paresthesias are detected Psychiatric:  The pt has Normal affect.   Non-Invasive Vascular Imaging:     Venous Reflux Times  +-------------------+---------+------+-----------+------------+------------  ----+  RIGHT             Reflux NoRefluxReflux TimeDiameter cmsComments                                        Yes                                            +-------------------+---------+------+-----------+------------+------------  ----+  CFV               no                                                       +-------------------+---------+------+-----------+------------+------------  ----+  FV mid             no                                                       +-------------------+---------+------+-----------+------------+------------  ----+  Popliteal         no                                                       +-------------------+---------+------+-----------+------------+------------  ----+  GSV at [facility]         no                            0.64                       +-------------------+---------+------+-----------+------------+------------  ----+  GSV prox thigh     no                            0.34                       +-------------------+---------+------+-----------+------------+------------  ----+  GSV mid thigh      no                            0.29    VV branch          +-------------------+---------+------+-----------+------------+------------  ----+  GSV dist thigh     no                            0.34                        +-------------------+---------+------+-----------+------------+------------  ----+  GSV at knee        no                            0.29                       +-------------------+---------+------+-----------+------------+------------  ----+  GSV prox calf      no                            0.34                       +-------------------+---------+------+-----------+------------+------------  ----+  SSV at SPJ                                       0.18                       +-------------------+---------+------+-----------+------------+------------  ----+  SSV prox calf                                            chronic  thrombus  +-------------------+---------+------+-----------+------------+------------  ----+  SSV mid calf                                             NWV                +-------------------+---------+------+-----------+------------+------------  ----+  VV to mid thigh GSV          yes  tortuous           +-------------------+---------+------+-----------+------------+------------  ----+     ASSESSMENT/PLAN:: 58 y.o. female presenting with bilateral lower extremity edema with superficial varicosities and telangiectasias.  Fortunately, she does not have significant pain with the varicosities or telangiectasias.  No recent bleeding episodes.  She does not have wounds, but does have lipodermatosclerosis.  Imaging was reviewed demonstrating no significant venous reflux.  Some reflux in her varicosities.  I think her lower extremity edema is likely multifactorial.  I think there is mixed lipedema, some level of lymphedema, and mild venous insufficiency.  I had a nice conversation with her regarding her legs, and the necessity for elevation and compression.  At the age of 80, and with fibrosis already setting in, she will be high risk in the coming years for wound development if we are not able to get her  edema under control.  Unfortunately, she is not a candidate for greater saphenous vein ablation as the vein does not demonstrate reflux.  There is chronic thrombus in the short saphenous vein.  The only intervention that I could offer would be stab phlebectomy, however I do not think that this would improve her edema, and being that the varicosity is asymptomatic, I do not think that there is significant benefit.  She was sized for compression stockings today and given a pair, as well as given a sheet outlining best medical management for lower extremity.   Fonda FORBES Rim, MD [facility] [phone]

## 2024-06-04 ENCOUNTER — Telehealth (HOSPITAL_COMMUNITY): Payer: Self-pay | Admitting: Physical Therapy

## 2024-06-04 ENCOUNTER — Ambulatory Visit (HOSPITAL_COMMUNITY): Admitting: Physical Therapy

## 2024-06-04 NOTE — Therapy (Unsigned)
" °  OUTPATIENT PHYSICAL THERAPY Wound EVALUATION   Patient Name: Sheila Wolfe MRN: [id number] DOB:[birth year], 59 y.o., female Today's Date: 06/04/2024   PCP: Vida Mardy DEL, PA-CREFERRING PROVIDER: Vida Mardy DEL, PA-C  END OF SESSION:   Past Medical History:  Diagnosis Date   Hemifacial spasm 02/08/2018   right   Varicose veins of both lower extremities    No past surgical history on file. Patient Active Problem List   Diagnosis Date Noted   Hemifacial spasm 02/08/2018    ONSET DATE: chronic   REFERRING DIAG:    left leg cellulitis    THERAPY DIAG:  lipolymphedema  Rationale for Evaluation and Treatment: Rehabilitation    HX:  per MD: Jenise has had bilateral lower extremity edema for years.  She has had episodes of bleeding and ulceration.  She has worn compression stockings in the past, but stated they ripped quite a while ago, and were $70, therefore she did not buy another pair.  When she wore compression, she stated that it did help.  PT most likely has mixed lipedema, some level of lymphedema, and mild venous insufficiency.   PATIENT EDUCATION: Education details: What lipolymphedema is how it is controlled. Person educated: Patient Education method: Explanation Education comprehension: verbalized understanding   HOME EXERCISE PROGRAM: ***   GOALS: Goals reviewed with patient? Yes  SHORT TERM GOALS: Target date: ***  *** Baseline: Goal status: {GOALSTATUS:25110}  2.  *** Baseline:  Goal status: {GOALSTATUS:25110}  3.  *** Baseline:  Goal status: {GOALSTATUS:25110}  4.  *** Baseline:  Goal status: {GOALSTATUS:25110}  5.  *** Baseline:  Goal status: {GOALSTATUS:25110}  6.  *** Baseline:  Goal status: {GOALSTATUS:25110}  LONG TERM GOALS: Target date: ***  *** Baseline:  Goal status: {GOALSTATUS:25110}  2.  *** Baseline:  Goal status: {GOALSTATUS:25110}  3.  *** Baseline:  Goal status: {GOALSTATUS:25110}  4.   *** Baseline:  Goal status: {GOALSTATUS:25110}  5.  *** Baseline:  Goal status: {GOALSTATUS:25110}  6.  *** Baseline:  Goal status: {GOALSTATUS:25110}  ASSESSMENT:  CLINICAL IMPRESSION: Patient is a *** y.o. *** who was seen today for physical therapy evaluation and treatment for ***.    OBJECTIVE IMPAIRMENTS: {opptimpairments:25111}.   ACTIVITY LIMITATIONS: {activitylimitations:27494}  PARTICIPATION LIMITATIONS: {participationrestrictions:25113}  PERSONAL FACTORS: {Personal factors:25162} are also affecting patient's functional outcome.   REHAB POTENTIAL: {rehabpotential:25112}  CLINICAL DECISION MAKING: {clinical decision making:25114}  EVALUATION COMPLEXITY: {Evaluation complexity:25115}  PLAN: PT FREQUENCY: {rehab frequency:25116}  PT DURATION: {rehab duration:25117}  PLANNED INTERVENTIONS: {rehab planned interventions:25118::97110-Therapeutic exercises,97530- Therapeutic [phone]- Neuromuscular re-education,97535- Self Rjmz,02859- Manual therapy,Patient/Family education}  PLAN FOR NEXT SESSION: ***   Tawni Melkonian,CINDY, PT 06/04/2024, 12:48 PM        "

## 2024-06-04 NOTE — Telephone Encounter (Signed)
 First no show:  Pt has nobody to bring her to therapy,  She will call back when she can make it.  Montie Metro, PT CLT [phone]
